# Patient Record
Sex: Male | Born: 1960 | Race: Black or African American | Hispanic: No | Marital: Single | Smoking: Current every day smoker
Health system: Southern US, Community
[De-identification: ages and names within clinical notes are randomized; demographics above are authoritative.]

---

## 2018-10-09 ENCOUNTER — Emergency Department (HOSPITAL_COMMUNITY)
Admission: EM | Admit: 2018-10-09 | Discharge: 2018-10-09 | Disposition: A | Discharge status: Home / Self Care | Payer: BLUE CROSS/BLUE SHIELD | Attending: Emergency Medicine | Admitting: Emergency Medicine

## 2018-10-09 ENCOUNTER — Emergency Department (HOSPITAL_COMMUNITY): Payer: BLUE CROSS/BLUE SHIELD

## 2018-10-09 ENCOUNTER — Encounter (HOSPITAL_COMMUNITY): Payer: Self-pay | Admitting: Emergency Medicine

## 2018-10-09 DIAGNOSIS — R55 Syncope and collapse: Secondary | ICD-10-CM | POA: Insufficient documentation

## 2018-10-09 DIAGNOSIS — F17210 Nicotine dependence, cigarettes, uncomplicated: Secondary | ICD-10-CM | POA: Insufficient documentation

## 2018-10-09 DIAGNOSIS — R42 Dizziness and giddiness: Secondary | ICD-10-CM | POA: Diagnosis not present

## 2018-10-09 DIAGNOSIS — F1410 Cocaine abuse, uncomplicated: Secondary | ICD-10-CM | POA: Insufficient documentation

## 2018-10-09 LAB — DIFFERENTIAL
BAND NEUTROPHILS: 0 %
BASOS ABS: 0.1 10*3/uL (ref 0.0–0.1)
BASOS PCT: 1 %
Blasts: 0 %
EOS ABS: 0.1 10*3/uL (ref 0.0–0.5)
EOS PCT: 0 %
LYMPHS ABS: 1.3 10*3/uL (ref 0.7–4.0)
Lymphocytes Relative: 11 %
MONOS PCT: 6 %
MYELOCYTES: 0 %
Metamyelocytes Relative: 0 %
Monocytes Absolute: 0.8 10*3/uL (ref 0.1–1.0)
NEUTROS PCT: 82 %
Neutro Abs: 9.8 10*3/uL — ABNORMAL HIGH (ref 1.7–7.7)
Other: 0 %
PROMYELOCYTES RELATIVE: 0 %
nRBC: 0 /100 WBC

## 2018-10-09 LAB — CBC
HCT: 49.8 % (ref 39.0–52.0)
Hemoglobin: 16 g/dL (ref 13.0–17.0)
MCH: 30.4 pg (ref 26.0–34.0)
MCHC: 32.1 g/dL (ref 30.0–36.0)
MCV: 94.7 fL (ref 80.0–100.0)
PLATELETS: 330 10*3/uL (ref 150–400)
RBC: 5.26 MIL/uL (ref 4.22–5.81)
RDW: 13.5 % (ref 11.5–15.5)
WBC: 12 10*3/uL — ABNORMAL HIGH (ref 4.0–10.5)
nRBC: 0 % (ref 0.0–0.2)

## 2018-10-09 LAB — URINALYSIS, ROUTINE W REFLEX MICROSCOPIC
Bacteria, UA: NONE SEEN
Bilirubin Urine: NEGATIVE
Glucose, UA: NEGATIVE mg/dL
Ketones, ur: 5 mg/dL — AB
Leukocytes, UA: NEGATIVE
Nitrite: NEGATIVE
PH: 5 (ref 5.0–8.0)
Protein, ur: NEGATIVE mg/dL
SPECIFIC GRAVITY, URINE: 1.013 (ref 1.005–1.030)

## 2018-10-09 LAB — BASIC METABOLIC PANEL
Anion gap: 11 (ref 5–15)
BUN: 13 mg/dL (ref 6–20)
CALCIUM: 9.3 mg/dL (ref 8.9–10.3)
CO2: 26 mmol/L (ref 22–32)
CREATININE: 1 mg/dL (ref 0.61–1.24)
Chloride: 103 mmol/L (ref 98–111)
GFR calc non Af Amer: >60 mL/min (ref >60)
GLUCOSE: 93 mg/dL (ref 70–99)
Potassium: 3.6 mmol/L (ref 3.5–5.1)
Sodium: 140 mmol/L (ref 135–145)

## 2018-10-09 LAB — TROPONIN I: Troponin I: <0.03 ng/mL (ref <0.03)

## 2018-10-09 LAB — ETHANOL: Alcohol, Ethyl (B): <10 mg/dL (ref <10)

## 2018-10-09 LAB — RAPID URINE DRUG SCREEN, HOSP PERFORMED
Amphetamines: NOT DETECTED
Barbiturates: NOT DETECTED
Benzodiazepines: NOT DETECTED
COCAINE: POSITIVE — AB
Opiates: NOT DETECTED
Tetrahydrocannabinol: NOT DETECTED

## 2018-10-09 MED ORDER — SODIUM CHLORIDE 0.9 % IV BOLUS
1000.0000 mL | Freq: Once | INTRAVENOUS | Status: AC
  Administered 2018-10-09: 1000 mL via INTRAVENOUS

## 2018-10-09 NOTE — ED Notes (Signed)
Patient given discharge instruction, verbalized understand. IV removed, band aid applied. Patient ambulatory out of the department.  

## 2018-10-09 NOTE — ED Notes (Signed)
Pt complaining of butt pain 2/10 from falling to floor after passing out. Family at the bedside.

## 2018-10-09 NOTE — ED Provider Notes (Signed)
[facility] EMERGENCY DEPARTMENT Provider Note   CSN: [id number] Arrival date & time: 10/09/18  4098     History   Chief Complaint Chief Complaint  Patient presents with  . Loss of Consciousness    HPI Jacob Cantu is a 57 y.o. male.  Patient was at work became dizzy and passed out today.  Patient feels fine now  The history is provided by the patient.  Loss of Consciousness   This is a new problem. The current episode started less than 1 hour ago. The problem occurs rarely. The problem has been resolved. He lost consciousness for a period of less than one minute. The problem is associated with normal activity. Associated symptoms include dizziness. Pertinent negatives include abdominal pain, back pain, chest pain, congestion, fever, headaches and seizures. He has tried nothing for the symptoms. The treatment provided no relief. His past medical history does not include CAD.    History reviewed. No pertinent past medical history.  There are no active problems to display for this patient.   History reviewed. No pertinent surgical history.      Home Medications    Prior to Admission medications   Not on File    Family History History reviewed. No pertinent family history.  Social History Social History   Tobacco Use  . Smoking status: Current Every Day Smoker    Packs/day: 1.00    Types: Cigarettes  Substance Use Topics  . Alcohol use: Yes    Comment: weekly  . Drug use: Never     Allergies   Patient has no known allergies.   Review of Systems Review of Systems  Constitutional: Negative for appetite change, fatigue and fever.  HENT: Negative for congestion, ear discharge and sinus pressure.   Eyes: Negative for discharge.  Respiratory: Negative for cough.   Cardiovascular: Positive for syncope. Negative for chest pain.  Gastrointestinal: Negative for abdominal pain and diarrhea.  Genitourinary: Negative for frequency and hematuria.    Musculoskeletal: Negative for back pain.  Skin: Negative for rash.  Neurological: Positive for dizziness. Negative for seizures and headaches.  Psychiatric/Behavioral: Negative for hallucinations.     Physical Exam Updated Vital Signs BP (!) 155/100   Pulse 71   Temp 97.6 F (36.4 C) (Oral)   Resp 15   Ht 6\' 2"  (1.88 m)   Wt 70.8 kg   SpO2 100%   BMI 20.03 kg/m   Physical Exam  Constitutional: He is oriented to person, place, and time. He appears well-developed.  HENT:  Head: Normocephalic.  Eyes: Conjunctivae and EOM are normal. No scleral icterus.  Neck: Neck supple. No thyromegaly present.  Cardiovascular: Normal rate and regular rhythm. Exam reveals no gallop and no friction rub.  No murmur heard. Pulmonary/Chest: No stridor. He has no wheezes. He has no rales. He exhibits no tenderness.  Abdominal: He exhibits no distension. There is no tenderness. There is no rebound.  Musculoskeletal: Normal range of motion. He exhibits no edema.  Lymphadenopathy:    He has no cervical adenopathy.  Neurological: He is oriented to person, place, and time. He exhibits normal muscle tone. Coordination normal.  Skin: No rash noted. No erythema.  Psychiatric: He has a normal mood and affect. His behavior is normal.     ED Treatments / Results  Labs (all labs ordered are listed, but only abnormal results are displayed) Labs Reviewed  CBC - Abnormal; Notable for the following components:      Result Value   WBC  12.0 (*)    All other components within normal limits  URINALYSIS, ROUTINE W REFLEX MICROSCOPIC - Abnormal; Notable for the following components:   Hgb urine dipstick SMALL (*)    Ketones, ur 5 (*)    All other components within normal limits  DIFFERENTIAL - Abnormal; Notable for the following components:   Neutro Abs 9.8 (*)    All other components within normal limits  RAPID URINE DRUG SCREEN, HOSP PERFORMED - Abnormal; Notable for the following components:   Cocaine  POSITIVE (*)    All other components within normal limits  BASIC METABOLIC PANEL  TROPONIN I  ETHANOL    EKG EKG Interpretation  Date/Time:  Monday October 09 2018 13:02:50 EDT Ventricular Rate:  77 PR Interval:    QRS Duration: 89 QT Interval:  424 QTC Calculation: 480 R Axis:   80 Text Interpretation:  Sinus rhythm Minimal ST depression, inferior leads Borderline prolonged QT interval Confirmed by Bethann Berkshire [id number]) on 10/09/2018 3:46:19 PM   Radiology Dg Chest 2 View  Result Date: 10/09/2018 CLINICAL DATA:  Syncope EXAM: CHEST - 2 VIEW COMPARISON:  None. FINDINGS: Lungs are clear. Heart size and pulmonary vascularity are normal. No adenopathy. There is aortic atherosclerosis. No adenopathy. No evident bone lesions. IMPRESSION: Aortic atherosclerosis.  No edema or consolidation. Aortic Atherosclerosis (ICD10-I70.0). Electronically Signed   By: Bretta Bang III M.D.   On: 10/09/2018 13:25   Ct Head Wo Contrast  Result Date: 10/09/2018 CLINICAL DATA:  Recent syncopal event EXAM: CT HEAD WITHOUT CONTRAST TECHNIQUE: Contiguous axial images were obtained from the base of the skull through the vertex without intravenous contrast. COMPARISON:  None. FINDINGS: Brain: Scattered hypodensity is identified throughout the deep white matter bilaterally consistent with chronic white matter ischemic change. 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 No findings to suggest acute hemorrhage, acute infarction or space-occupying mass lesion are noted. Vascular: No hyperdense vessel or unexpected calcification. Skull: Normal. Negative for fracture or focal lesion. Sinuses/Orbits: No acute finding. Other: None. IMPRESSION: Chronic white matter ischemic change without acute abnormality. Electronically Signed   By: Alcide Clever M.D.   On: 10/09/2018 13:38    Procedures Procedures (including critical care time)  Medications Ordered in  ED Medications  sodium chloride 0.9 % bolus 1,000 mL (1,000 mLs Intravenous New Bag/Given 10/09/18 1301)     Initial Impression / Assessment and Plan / ED Course  I have reviewed the triage vital signs and the nursing notes.  Pertinent labs & imaging results that were available during my care of the patient were reviewed by me and considered in my medical decision making (see chart for details).    Patient with syncope.  Labs chest x-ray EKG unremarkable.  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  Patient blood pressure has been mildly elevated.  Patient will be referred back to his family doctor for possible blood pressure control  Final Clinical Impressions(s) / ED Diagnoses   Final diagnoses:  Syncope and collapse    ED Discharge Orders    None       Bethann Berkshire, MD 10/09/18 [phone]

## 2018-10-09 NOTE — Discharge Instructions (Addendum)
Drink plenty of fluids and follow-up with your family doctor in the next week for recheck.  Your blood pressure is mildly elevated and needs to be followed

## 2018-10-09 NOTE — ED Notes (Signed)
Pt understands we need a urine sample, cannot provide sample at this time, call bell at bedside

## 2018-10-09 NOTE — ED Triage Notes (Signed)
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  Pt states he feels fine right now.

## 2018-10-24 DIAGNOSIS — Z681 Body mass index (BMI) 19 or less, adult: Secondary | ICD-10-CM | POA: Diagnosis not present

## 2018-10-24 DIAGNOSIS — Z0000 Encounter for general adult medical examination without abnormal findings: Secondary | ICD-10-CM | POA: Diagnosis not present

## 2018-10-24 DIAGNOSIS — Z1389 Encounter for screening for other disorder: Secondary | ICD-10-CM | POA: Diagnosis not present

## 2018-11-21 DIAGNOSIS — Z6820 Body mass index (BMI) 20.0-20.9, adult: Secondary | ICD-10-CM | POA: Diagnosis not present

## 2018-11-21 DIAGNOSIS — Z1389 Encounter for screening for other disorder: Secondary | ICD-10-CM | POA: Diagnosis not present

## 2018-11-21 DIAGNOSIS — I10 Essential (primary) hypertension: Secondary | ICD-10-CM | POA: Diagnosis not present

## 2019-06-20 DIAGNOSIS — Z681 Body mass index (BMI) 19 or less, adult: Secondary | ICD-10-CM | POA: Diagnosis not present

## 2019-06-20 DIAGNOSIS — I10 Essential (primary) hypertension: Secondary | ICD-10-CM | POA: Diagnosis not present

## 2019-06-20 DIAGNOSIS — Z1389 Encounter for screening for other disorder: Secondary | ICD-10-CM | POA: Diagnosis not present

## 2019-07-03 ENCOUNTER — Encounter: Payer: Self-pay | Admitting: Gastroenterology

## 2019-08-21 ENCOUNTER — Ambulatory Visit: Payer: BLUE CROSS/BLUE SHIELD | Admitting: Gastroenterology

## 2019-08-21 ENCOUNTER — Telehealth: Payer: Self-pay | Admitting: Gastroenterology

## 2019-08-21 ENCOUNTER — Encounter: Payer: Self-pay | Admitting: Gastroenterology

## 2019-08-21 NOTE — Telephone Encounter (Signed)
PATIENT WAS A NO SHOW AND LETTER SENT  °

## 2020-06-14 IMAGING — CT CT HEAD W/O CM
3 series · 16 of 47 positions shown, 19 images · non-contrast
Comparison: None.

CLINICAL DATA: Recent syncopal event

EXAM:
CT HEAD WITHOUT CONTRAST
TECHNIQUE: Contiguous axial images were obtained from the base of the skull
through the vertex without intravenous contrast.

[Series 2: head trauma wo · axial · 0.46mm/px · z∈[+106,+236]mm · 10 of 32 slices shown, 13 images]
[im 3/32  brain]
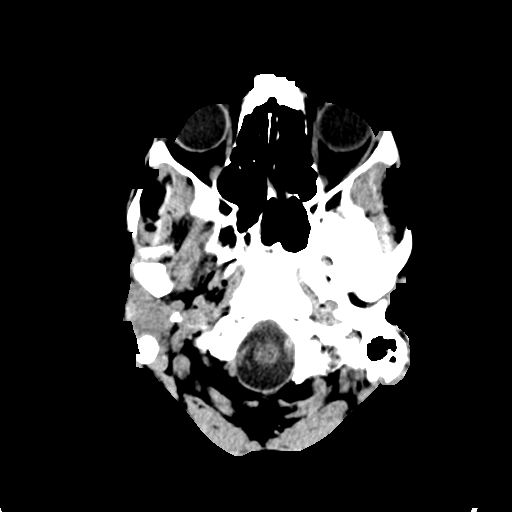
[im 3/32  bone]
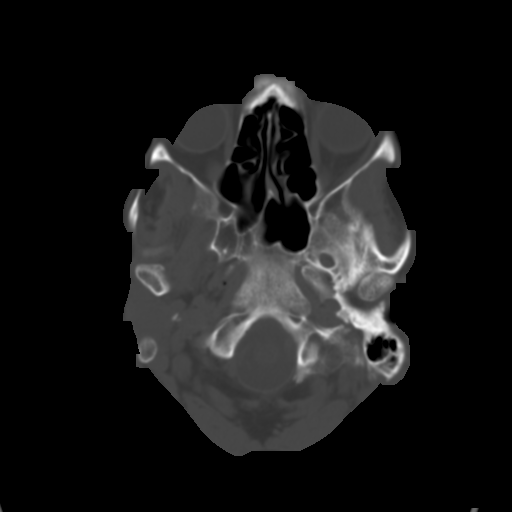
[im 6/32  brain]
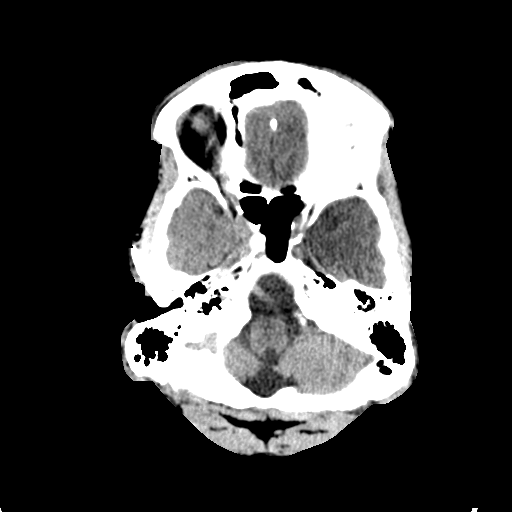
[im 9/32  brain]
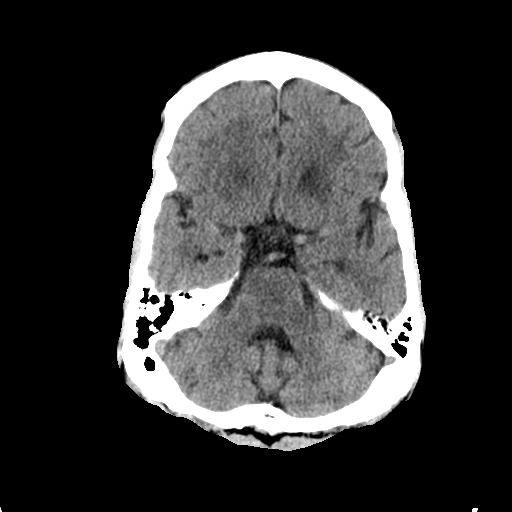
[im 11/32  brain]
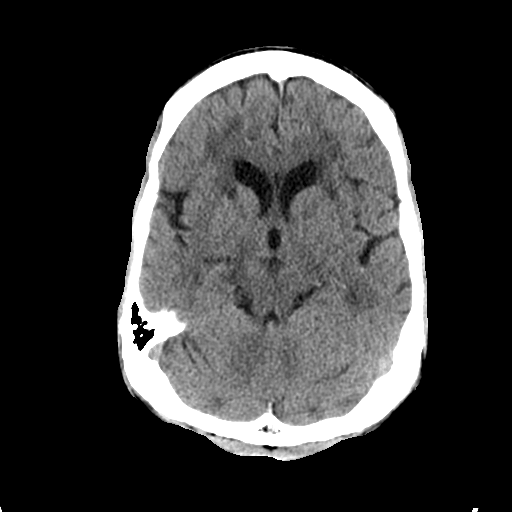
[im 14/32  brain]
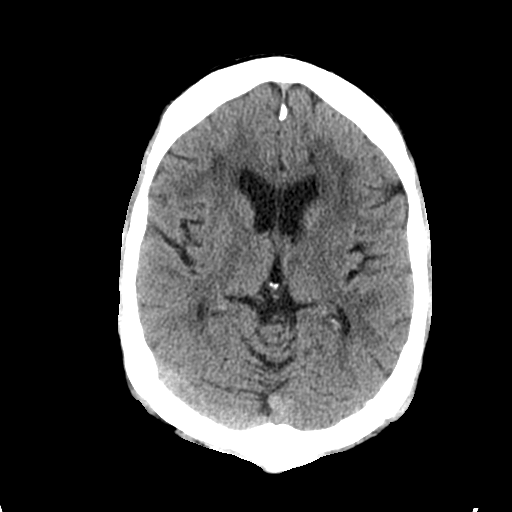
[im 14/32  bone]
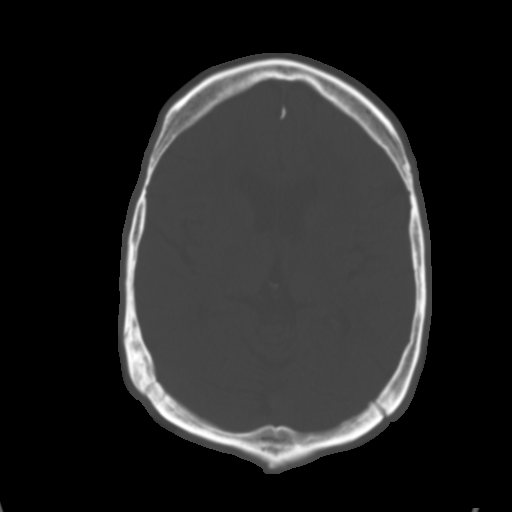
[im 18/32  brain]
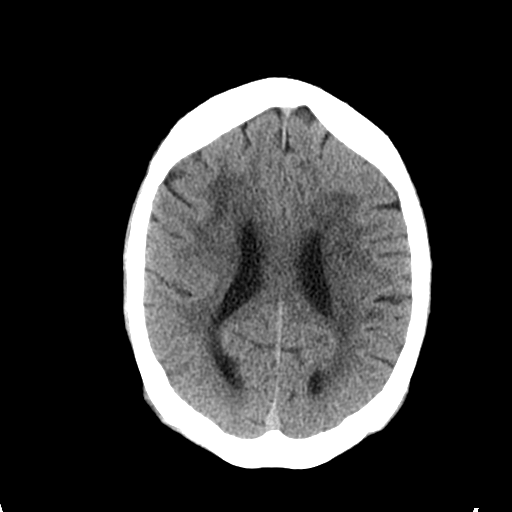
[im 21/32  brain]
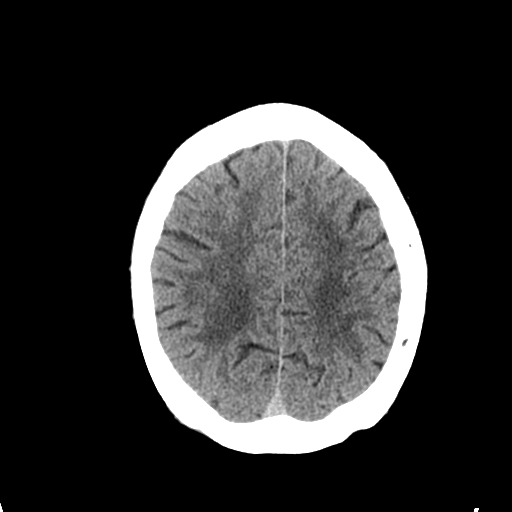
[im 24/32  brain]
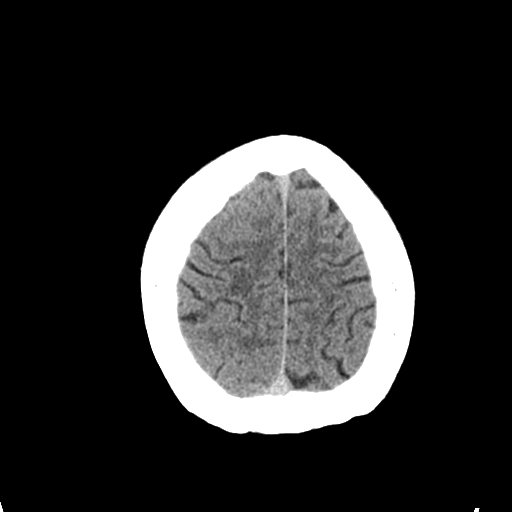
[im 26/32  brain]
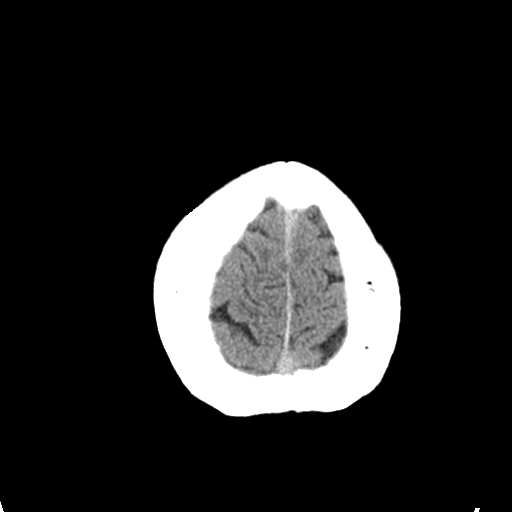
[im 26/32  bone]
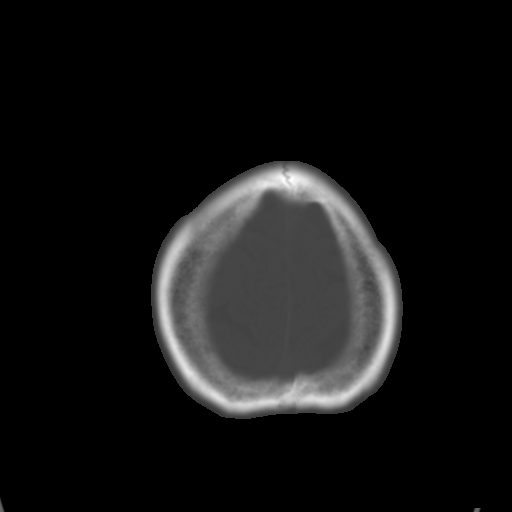
[im 29/32  brain]
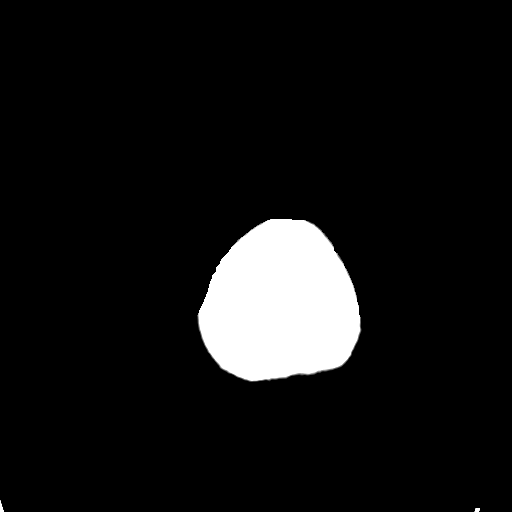

[Series 4: coronal soft tissue · coronal · 0.33mm/px · 3 of 67 slices shown]
[im 23/67  brain]
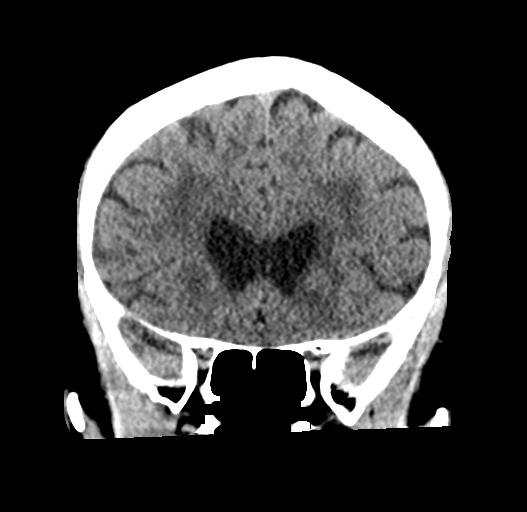
[im 30/67  brain]
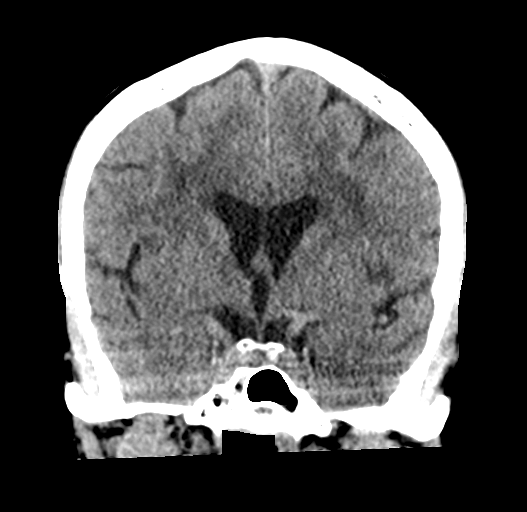
[im 37/67  brain]
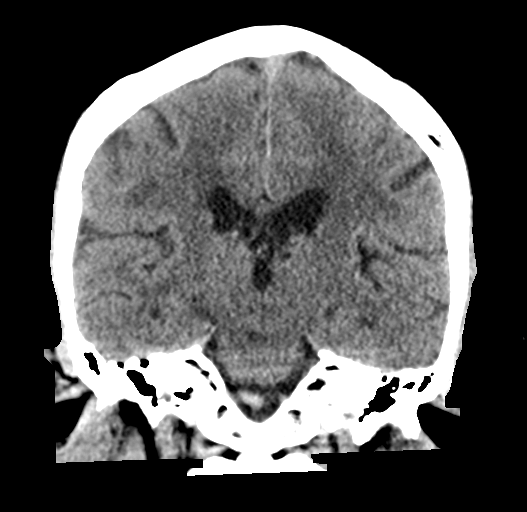

[Series 5: sagittal soft tissue · sagittal · 0.34mm/px · 3 of 59 slices shown]
[im 20/59  brain]
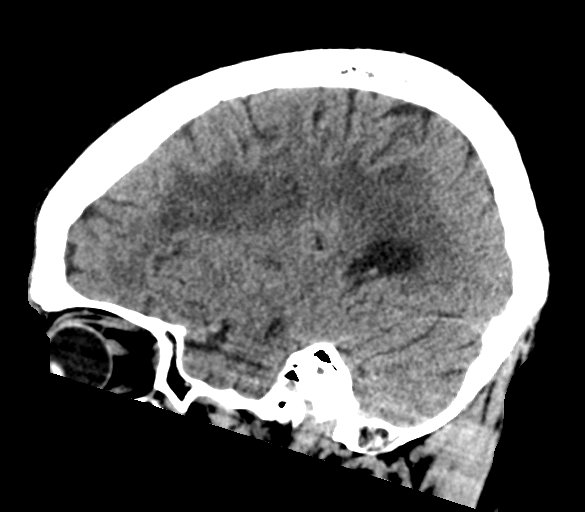
[im 30/59  brain]
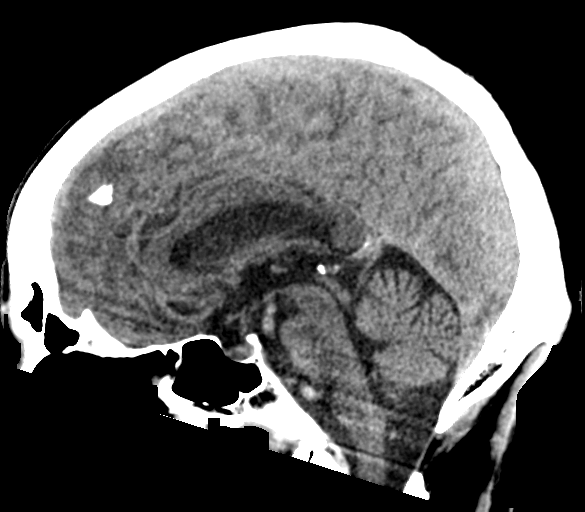
[im 39/59  brain]
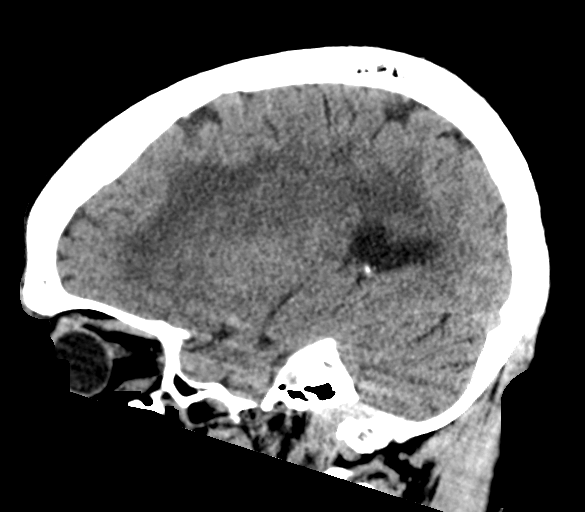

[16 of 47 positions shown; findings below may reference images not displayed]

FINDINGS: Brain: Scattered hypodensity is identified throughout the deep white
matter bilaterally consistent with chronic white matter ischemic
change. Prior lacunar infarct is noted in the anterior aspect of the
internal capsule on the right. No findings to suggest acute
hemorrhage, acute infarction or space-occupying mass lesion are
noted.

Vascular: No hyperdense vessel or unexpected calcification.

Skull: Normal. Negative for fracture or focal lesion.

Sinuses/Orbits: No acute finding.

Other: None.
IMPRESSION: Chronic white matter ischemic change without acute abnormality.

## 2020-06-14 IMAGING — DX DG CHEST 2V
2 series · 2 of 2 positions shown · non-contrast
Comparison: None.

CLINICAL DATA: Syncope

EXAM:
CHEST - 2 VIEW

[chest pa]
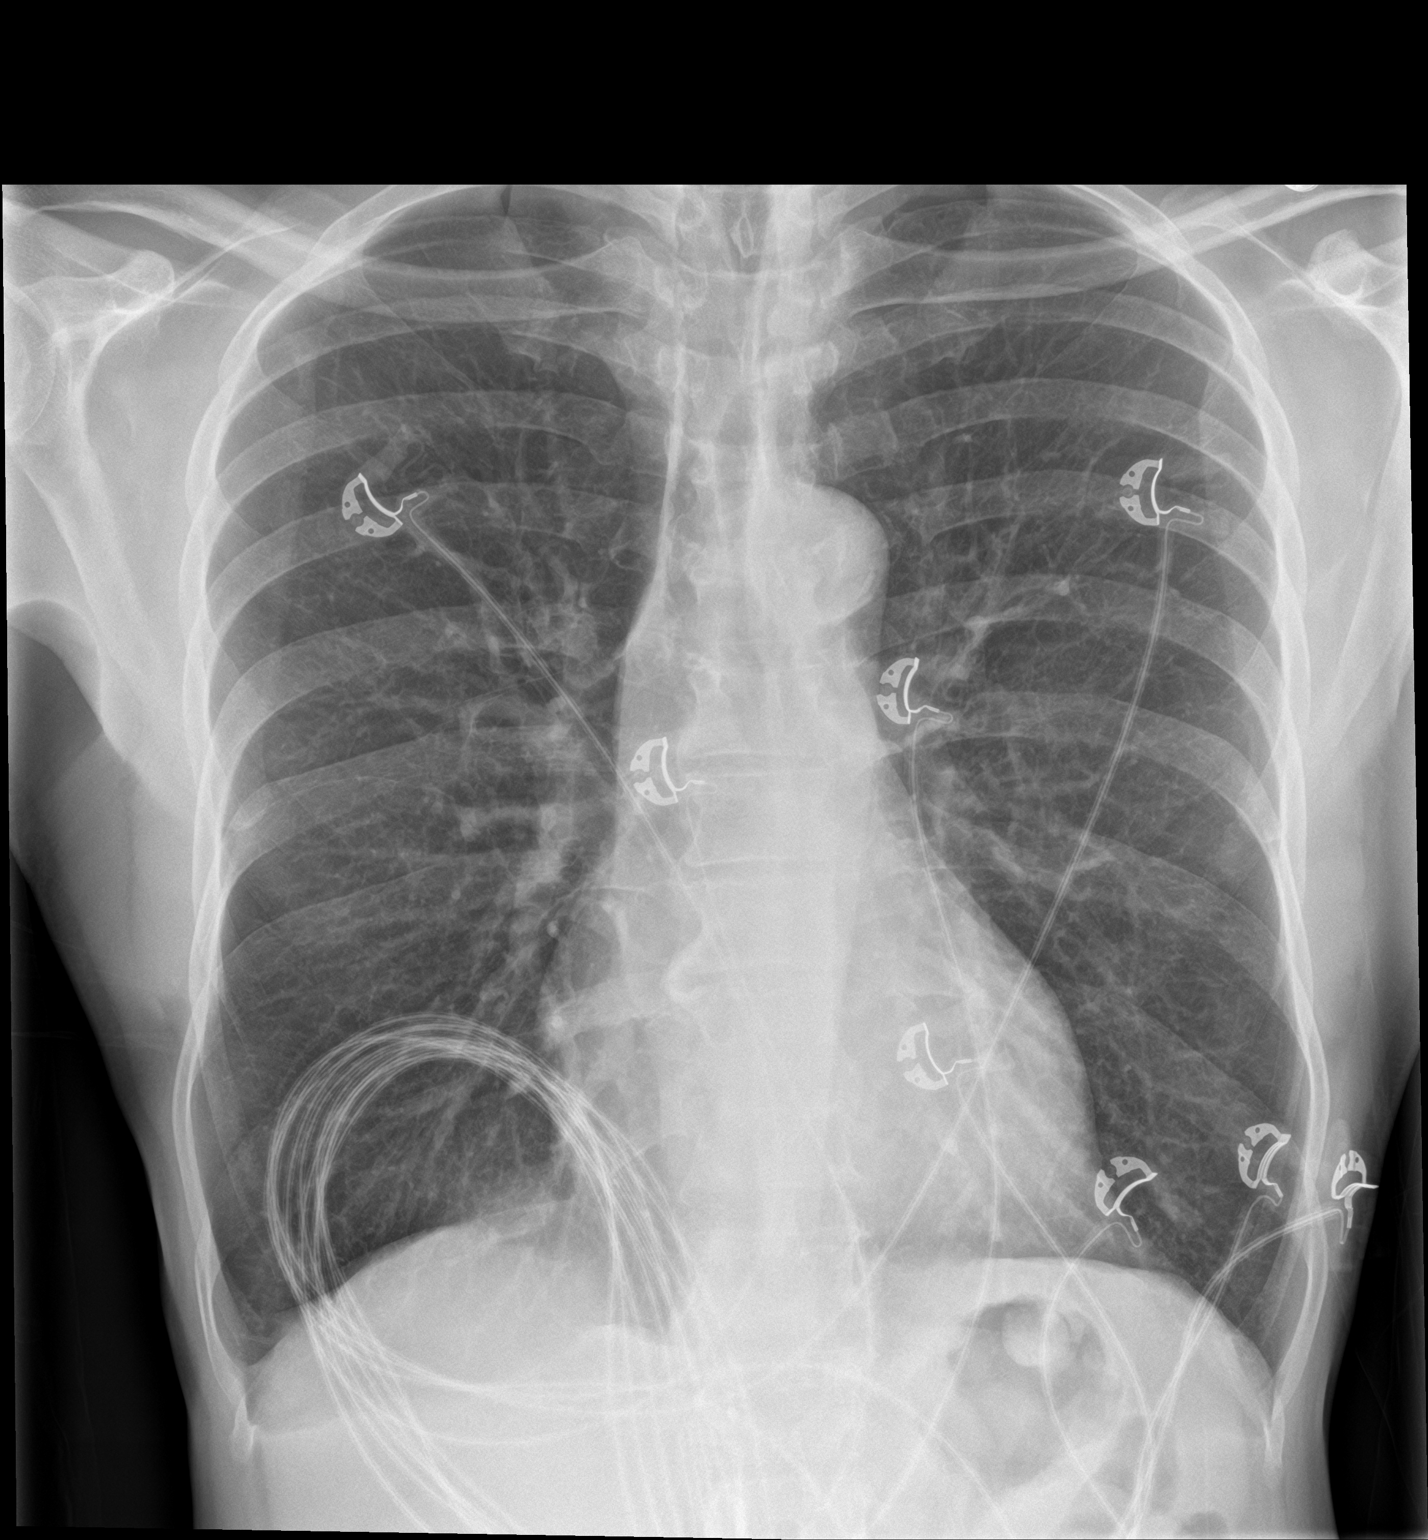

[chest lat]
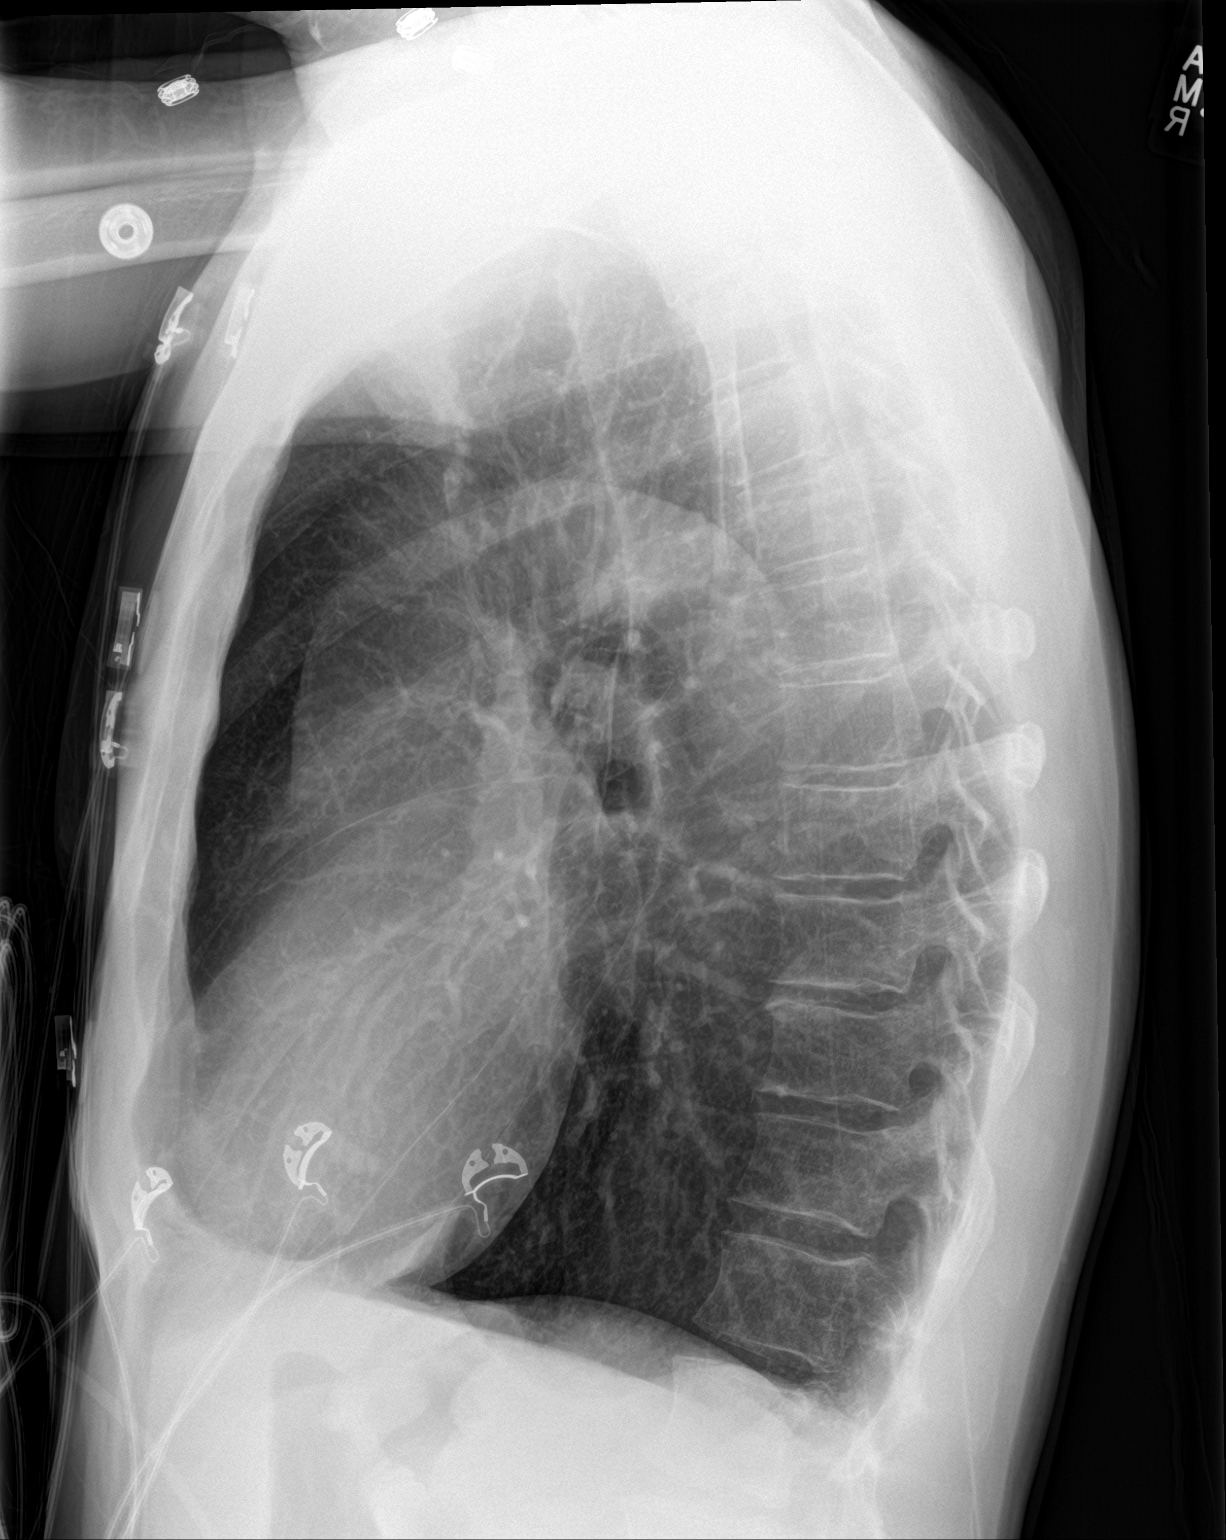

[2 of 2 positions shown; findings below may reference images not displayed]

FINDINGS: Lungs are clear. Heart size and pulmonary vascularity are normal. No
adenopathy. There is aortic atherosclerosis. No adenopathy. No
evident bone lesions.
IMPRESSION: Aortic atherosclerosis.  No edema or consolidation.

Aortic Atherosclerosis ([id number]).

## 2021-12-20 DEATH — deceased
